# Patient Record
Sex: Male | Born: 1995 | Race: White | Hispanic: No | Marital: Single | Smoking: Current every day smoker
Health system: Southern US, Community
[De-identification: ages and names within clinical notes are randomized; demographics above are authoritative.]

---
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
## 2014-05-04 ENCOUNTER — Emergency Department: Payer: Self-pay | Admitting: Emergency Medicine

## 2014-05-05 ENCOUNTER — Emergency Department: Payer: Self-pay | Admitting: Emergency Medicine

## 2019-09-06 ENCOUNTER — Other Ambulatory Visit: Payer: Self-pay

## 2019-09-06 DIAGNOSIS — Z20822 Contact with and (suspected) exposure to covid-19: Secondary | ICD-10-CM

## 2019-09-09 LAB — NOVEL CORONAVIRUS, NAA: SARS-CoV-2, NAA: NOT DETECTED

## 2020-01-06 ENCOUNTER — Emergency Department
Admission: EM | Admit: 2020-01-06 | Discharge: 2020-01-06 | Disposition: A | Discharge status: Home / Self Care | Payer: Self-pay | Attending: Emergency Medicine | Admitting: Emergency Medicine

## 2020-01-06 ENCOUNTER — Other Ambulatory Visit: Payer: Self-pay

## 2020-01-06 DIAGNOSIS — R112 Nausea with vomiting, unspecified: Secondary | ICD-10-CM | POA: Insufficient documentation

## 2020-01-06 DIAGNOSIS — F17210 Nicotine dependence, cigarettes, uncomplicated: Secondary | ICD-10-CM | POA: Insufficient documentation

## 2020-01-06 DIAGNOSIS — Z0289 Encounter for other administrative examinations: Secondary | ICD-10-CM | POA: Insufficient documentation

## 2020-01-06 NOTE — ED Triage Notes (Signed)
Pt reports that he started this am with N/V - vomited x4-5 in 24 hours -  pt children have had same x3 days - pt reports that he called out of work and requires a doctors note to return

## 2020-01-06 NOTE — ED Provider Notes (Signed)
[facility] Emergency Department Provider Note  ____________________________________________  Time seen: Approximately 9:33 PM  I have reviewed the triage vital signs and the nursing notes.   HISTORY  Chief Complaint Vomiting   HPI Jeremy Watkins is a 24 y.o. male who presents to the emergency department for a work excuse for today.  He states that his entire family has had a 24-hour stomach virus and he himself had some nausea and vomiting earlier today.  Because he called out of work, he has to have a note that says that he is able to return.  He denies any complaints currently.  No vomiting in several hours.  History reviewed. No pertinent past medical history.  There are no problems to display for this patient.   History reviewed. No pertinent surgical history.  Prior to Admission medications   Not on File    Allergies Patient has no known allergies.  No family history on file.  Social History Social History   Tobacco Use  . Smoking status: Current Every Day Smoker    Packs/day: 1.00    Types: Cigarettes  . Smokeless tobacco: Never Used  Substance Use Topics  . Alcohol use: Never  . Drug use: Never    Review of Systems Constitutional: Negative for fever. ENT: Negative for sore throat. Respiratory: Negative for cough Gastrointestinal: No abdominal pain.  Positive for nausea and vomiting.  No diarrhea.  Musculoskeletal: Negative for generalized body aches. Skin: Negative for rash/lesion/wound. Neurological: Negative for headaches, focal weakness or numbness.  ____________________________________________   PHYSICAL EXAM:  VITAL SIGNS: ED Triage Vitals  Enc Vitals Group     BP 01/06/20 2030 126/61     Pulse Rate 01/06/20 2030 97     Resp 01/06/20 2030 16     Temp 01/06/20 2030 98.2 F (36.8 C)     Temp Source 01/06/20 2030 Oral     SpO2 01/06/20 2030 99 %     Weight 01/06/20 2031 145 lb (65.8 kg)     Height 01/06/20 2031 5'  10" (1.778 m)     Head Circumference --      Peak Flow --      Pain Score 01/06/20 2030 0     Pain Loc --      Pain Edu? --      Excl. in GC? --     Constitutional: Alert and oriented. Well appearing and in no acute distress. Eyes: Conjunctivae are normal. PERRL. EOMI. Head: Atraumatic. Nose: No congestion/rhinnorhea. Mouth/Throat: Mucous membranes are moist. Neck: No stridor.  Cardiovascular: Normal rate, regular rhythm. Good peripheral circulation. Respiratory: Normal respiratory effort. Musculoskeletal: Full ROM throughout.  Neurologic:  Normal speech and language. No gross focal neurologic deficits are appreciated. Speech is normal. No gait instability. Skin:  Skin is warm, dry and intact. No rash noted. Psychiatric: Mood and affect are normal. Speech and behavior are normal.  ____________________________________________   LABS (all labs ordered are listed, but only abnormal results are displayed)  Labs Reviewed - No data to display ____________________________________________  EKG  Not indicated ____________________________________________  RADIOLOGY  Not indicated ____________________________________________   PROCEDURES  None ____________________________________________   INITIAL IMPRESSION / ASSESSMENT AND PLAN / ED COURSE     Pertinent labs & imaging results that were available during my care of the patient were reviewed by me and considered in my medical decision making (see chart for details).  Patient was advised stay well-hydrated, start with bland diet and increase slowly.  He  was advised that if he develops any other symptoms of concern, that he is to see his primary care provider or return to the ER. Work note provided to return tomorrow unless he has fevers or continued vomiting. ____________________________________________   FINAL CLINICAL IMPRESSION(S) / ED DIAGNOSES  Final diagnoses:  Encounter to obtain excuse from work        Jeremy Watkins, Jeremy Watkins 01/06/20 2137    Jeremy Bruce, MD 01/09/20 [phone]

## 2020-09-13 ENCOUNTER — Emergency Department: Payer: Self-pay

## 2020-09-13 ENCOUNTER — Other Ambulatory Visit: Payer: Self-pay

## 2020-09-13 ENCOUNTER — Encounter: Payer: Self-pay | Admitting: Emergency Medicine

## 2020-09-13 ENCOUNTER — Emergency Department
Admission: EM | Admit: 2020-09-13 | Discharge: 2020-09-13 | Disposition: A | Discharge status: Home / Self Care | Payer: Self-pay | Attending: Emergency Medicine | Admitting: Emergency Medicine

## 2020-09-13 DIAGNOSIS — F17210 Nicotine dependence, cigarettes, uncomplicated: Secondary | ICD-10-CM | POA: Insufficient documentation

## 2020-09-13 DIAGNOSIS — R519 Headache, unspecified: Secondary | ICD-10-CM | POA: Insufficient documentation

## 2020-09-13 DIAGNOSIS — Z20822 Contact with and (suspected) exposure to covid-19: Secondary | ICD-10-CM | POA: Insufficient documentation

## 2020-09-13 LAB — RESP PANEL BY RT-PCR (FLU A&B, COVID) ARPGX2
Influenza A by PCR: NEGATIVE
Influenza B by PCR: NEGATIVE
SARS Coronavirus 2 by RT PCR: NEGATIVE

## 2020-09-13 MED ORDER — KETOROLAC TROMETHAMINE 30 MG/ML IJ SOLN
30.0000 mg | Freq: Once | INTRAMUSCULAR | Status: DC
  Filled 2020-09-13: qty 1

## 2020-09-13 MED ORDER — ONDANSETRON HCL 4 MG/2ML IJ SOLN
4.0000 mg | Freq: Once | INTRAMUSCULAR | Status: DC
  Filled 2020-09-13: qty 2

## 2020-09-13 MED ORDER — DIPHENHYDRAMINE HCL 50 MG/ML IJ SOLN
25.0000 mg | Freq: Once | INTRAMUSCULAR | Status: DC
  Filled 2020-09-13: qty 1

## 2020-09-13 MED ORDER — SODIUM CHLORIDE 0.9 % IV BOLUS: 1000.0000 mL | Freq: Once | INTRAVENOUS | Status: DC

## 2020-09-13 NOTE — ED Notes (Signed)
See triage note. H/a not responding to meds.

## 2020-09-13 NOTE — ED Notes (Addendum)
Morrie Sheldon, PA at bedside to discuss plan of care with this pt. Pt has refused IV medications. Pt stormed out and states, "that is fucking ridiculous, if you aint going to give me anything then why you keep talking." at this point, pt has eloped

## 2020-09-13 NOTE — ED Provider Notes (Signed)
[facility] Emergency Department Provider Note  ____________________________________________  Time seen: Approximately 1:43 PM  I have reviewed the triage vital signs and the nursing notes.   HISTORY  Chief Complaint Headache    HPI Jeremy Watkins is a 24 y.o. male presents to emergency department for evaluation of a headache for 3 days.  Patient states that it feels like a band is squeezing around his head.  He does not regularly get headaches.  He denies any sick contacts.  He has not received a Covid vaccination.  He has taken Tylenol and Motrin at home without relief.  No fever, visual changes, light sensitivity, nausea, vomiting.   History reviewed. No pertinent past medical history.  There are no problems to display for this patient.   History reviewed. No pertinent surgical history.  Prior to Admission medications   Not on File    Allergies Patient has no known allergies.  No family history on file.  Social History Social History   Tobacco Use  . Smoking status: Current Every Day Smoker    Packs/day: 1.00    Types: Cigarettes  . Smokeless tobacco: Never Used  Substance Use Topics  . Alcohol use: Never  . Drug use: Never     Review of Systems  Constitutional: No fever/chills Cardiovascular: No chest pain. Respiratory: No SOB. Gastrointestinal: No abdominal pain.  No nausea, no vomiting.  Musculoskeletal: Negative for musculoskeletal pain. Skin: Negative for rash, abrasions, lacerations, ecchymosis. Neurological: Negative for numbness or tingling.  Positive for headache.   ____________________________________________   PHYSICAL EXAM:  VITAL SIGNS: ED Triage Vitals  Enc Vitals Group     BP 09/13/20 1316 (!) 108/56     Pulse Rate 09/13/20 1316 73     Resp 09/13/20 1316 16     Temp 09/13/20 1316 97.6 F (36.4 C)     Temp Source 09/13/20 1316 Oral     SpO2 09/13/20 1316 97 %     Weight 09/13/20 1311 140 lb (63.5 kg)      Height 09/13/20 1311 5\' 10"  (1.778 m)     Head Circumference --      Peak Flow --      Pain Score 09/13/20 1311 9     Pain Loc --      Pain Edu? --      Excl. in GC? --      Constitutional: Alert and oriented. Well appearing and in no acute distress. Eyes: Conjunctivae are normal. PERRL. EOMI. Head: Atraumatic. ENT:      Ears:      Nose: No congestion/rhinnorhea.      Mouth/Throat: Mucous membranes are moist.  Neck: No stridor. No cervical spine tenderness to palpation. Cardiovascular: Normal rate, regular rhythm.  Good peripheral circulation. Respiratory: Normal respiratory effort without tachypnea or retractions. Lungs CTAB. Good air entry to the bases with no decreased or absent breath sounds. Gastrointestinal: Bowel sounds 4 quadrants. Soft and nontender to palpation. No guarding or rigidity. No palpable masses. No distention.  Musculoskeletal: Full range of motion to all extremities. No gross deformities appreciated. Neurologic:  Normal speech and language. No gross focal neurologic deficits are appreciated.  Skin:  Skin is warm, dry and intact. No rash noted. Psychiatric: Mood and affect are normal. Speech and behavior are normal. Patient exhibits appropriate insight and judgement.   ____________________________________________   LABS (all labs ordered are listed, but only abnormal results are displayed)  Labs Reviewed  RESP PANEL BY RT-PCR (FLU A&B, COVID)  [id number]   ____________________________________________  EKG   ____________________________________________  RADIOLOGY   CT Head Wo Contrast  Result Date: 09/13/2020 CLINICAL DATA:  Chronic headache for several days EXAM: CT HEAD WITHOUT CONTRAST TECHNIQUE: Contiguous axial images were obtained from the base of the skull through the vertex without intravenous contrast. COMPARISON:  None. FINDINGS: Brain: Ventricles and sulci are normal in size and configuration. The right lateral ventricle is slightly larger  than the left lateral ventricle, a presumed anatomic variant. There is no intracranial mass, hemorrhage, extra-axial fluid collection, or midline shift. The brain parenchyma appears unremarkable. No evident acute infarct. Vascular: No hyperdense vessel. No appreciable vascular calcification. Skull: Bony calvarium appears intact. Sinuses/Orbits: Kozel thickening noted in several ethmoid air cells. Other visualized paranasal sinuses are clear. Visualized orbits appear symmetric bilaterally. Other: Mastoid air cells are clear. IMPRESSION: Foci of paranasal sinus disease.  Study otherwise unremarkable. Electronically Signed   By: Bretta Bang III M.D.   On: 09/13/2020 13:54    ____________________________________________    PROCEDURES  Procedure(s) performed:    Procedures    Medications  ketorolac (TORADOL) 30 MG/ML injection 30 mg (30 mg Intravenous Not Given 09/13/20 1522)  diphenhydrAMINE (BENADRYL) injection 25 mg (25 mg Intravenous Not Given 09/13/20 1522)  ondansetron (ZOFRAN) injection 4 mg (4 mg Intravenous Not Given 09/13/20 1522)  sodium chloride 0.9 % bolus 1,000 mL (1,000 mLs Intravenous Not Given 09/13/20 1522)     ____________________________________________   INITIAL IMPRESSION / ASSESSMENT AND PLAN / ED COURSE  Pertinent labs & imaging results that were available during my care of the patient were reviewed by me and considered in my medical decision making (see chart for details).  Review of the [place] CSRS was performed in accordance of the [facility] prior to dispensing any controlled drugs.   Patient presented to emergency department for evaluation of a headache for 3 days.  Patient does not have a history of headache so CT scan was ordered to initially evaluate.  Covid test is also ordered.  Patient is agreeable to medication for his headache.  ----------------------------------------- 2:20 PM on 09/13/2020 -----------------------------------------  CT scan is  negative for acute abnormalities.  Migraine cocktail of IV Toradol, Benadryl, Zofran was ordered for patient's headache.  ----------------------------------------- 3:12 PM on 09/13/2020 -----------------------------------------  Patient states that he did not come here for any shots and only came here for a migraine pill.  Patient states that he is afraid of needles and does not want any shots or IVs.  Given patient's fear of needles, I will order patient oral medication for his headache.  Patient states that he is not going to waste any more time and is going to leave.  Patient walked out of the ER.    Jeremy Watkins was evaluated in Emergency Department on 09/13/2020 for the symptoms described in the history of present illness. He was evaluated in the context of the global COVID-19 pandemic, which necessitated consideration that the patient might be at risk for infection with the SARS-CoV-2 virus that causes COVID-19. Institutional protocols and algorithms that pertain to the evaluation of patients at risk for COVID-19 are in a state of rapid change based on information released by regulatory bodies including the CDC and federal and state organizations. These policies and algorithms were followed during the patient's care in the ED.   ____________________________________________  FINAL CLINICAL IMPRESSION(S) / ED DIAGNOSES  Final diagnoses:  Acute intractable headache, unspecified headache type      NEW MEDICATIONS STARTED DURING  THIS VISIT:  ED Discharge Orders    None          This chart was dictated using voice recognition software/Dragon. Despite best efforts to proofread, errors can occur which can change the meaning. Any change was purely unintentional.    Enid Derry, PA-C 09/13/20 1821    Sharman Cheek, MD 09/15/20 [phone]

## 2020-09-13 NOTE — ED Notes (Signed)
This RN to administer medication at this time. Pt states, "I dont want anything involving needles. I will just go home." Spoke with [place], [place] about this conversation.

## 2020-09-13 NOTE — ED Triage Notes (Signed)
Pt reports HA for the past 3-4 days unrelieved by OTC meds.

## 2021-05-14 IMAGING — CT CT HEAD W/O CM
3 series · 16 of 46 positions shown, 19 images · non-contrast
Comparison: None.

CLINICAL DATA: Chronic headache for several days

EXAM:
CT HEAD WITHOUT CONTRAST
TECHNIQUE: Contiguous axial images were obtained from the base of the skull
through the vertex without intravenous contrast.

[Series 2: head wo · axial · 0.42mm/px · z∈[-131,-11]mm · 10 of 29 slices shown, 13 images]
[im 3/29  brain]
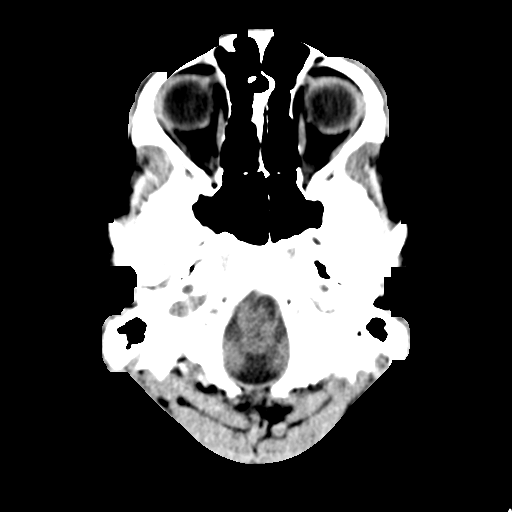
[im 3/29  bone]
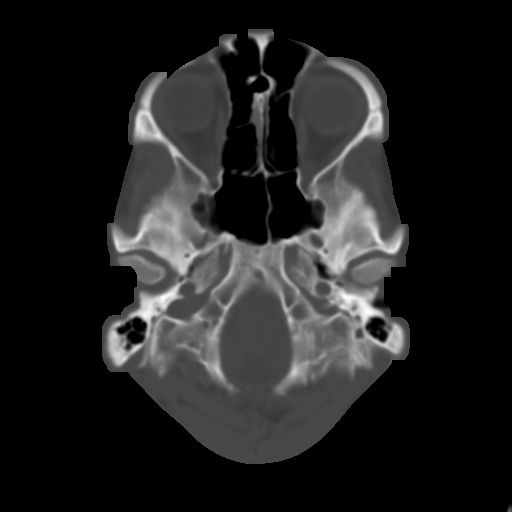
[im 6/29  brain]
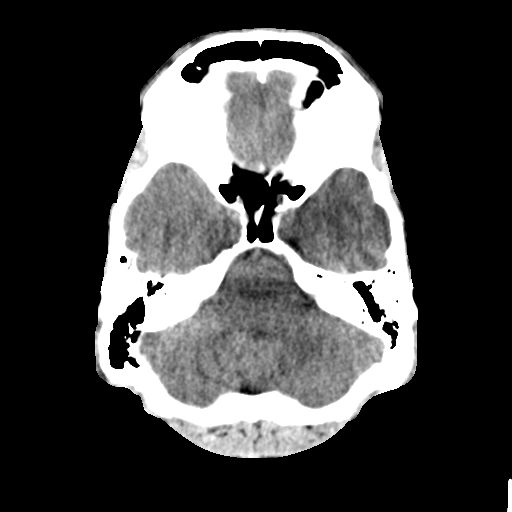
[im 8/29  brain]
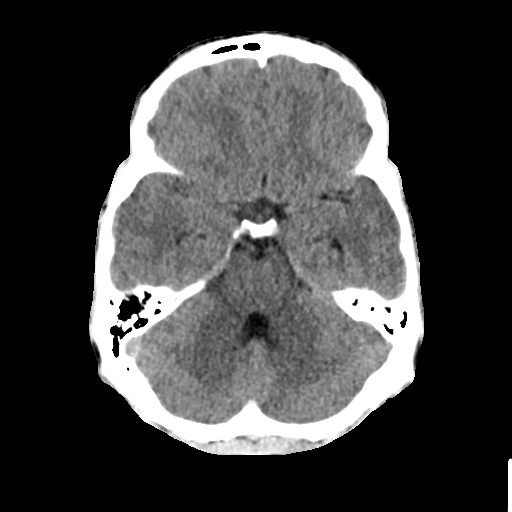
[im 11/29  brain]
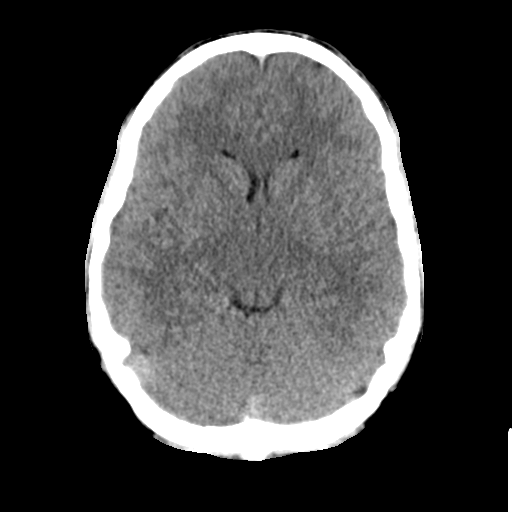
[im 14/29  brain]
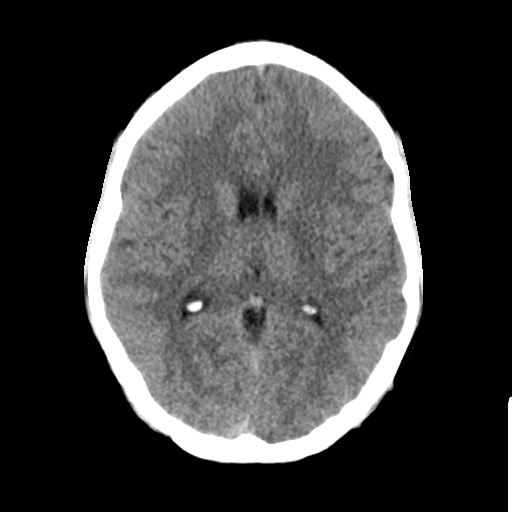
[im 14/29  bone]
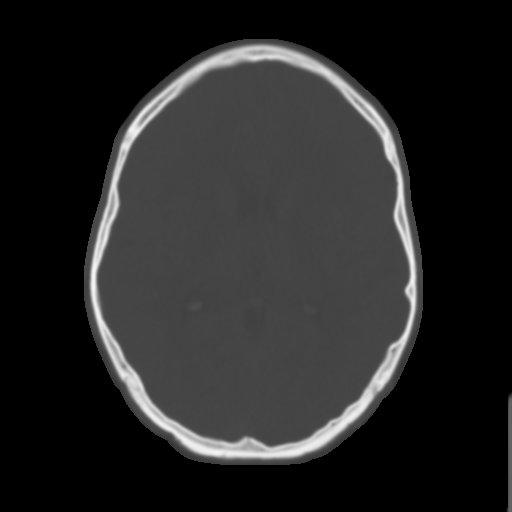
[im 16/29  brain]
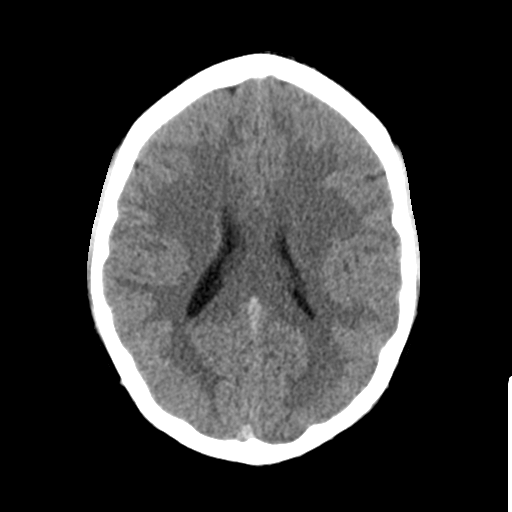
[im 19/29  brain]
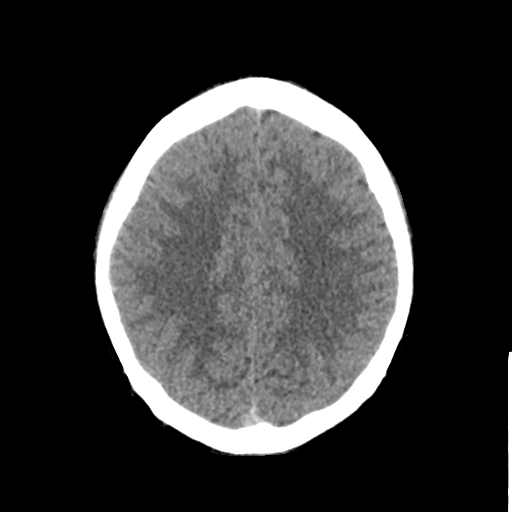
[im 22/29  brain]
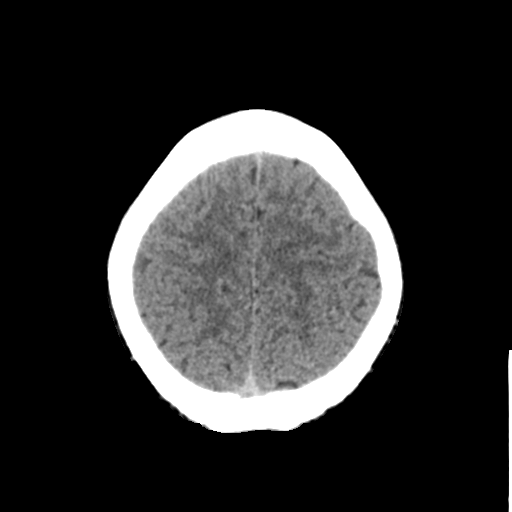
[im 24/29  brain]
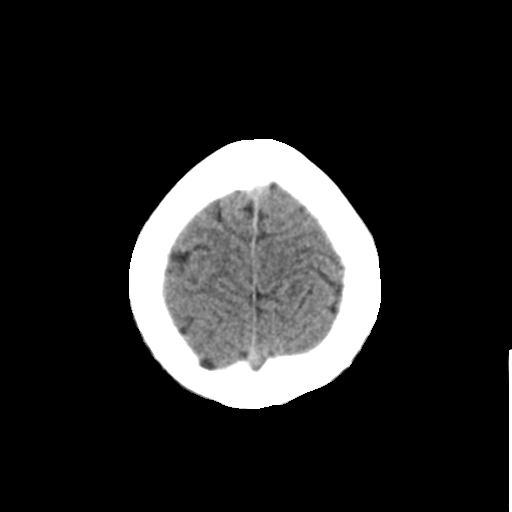
[im 24/29  bone]
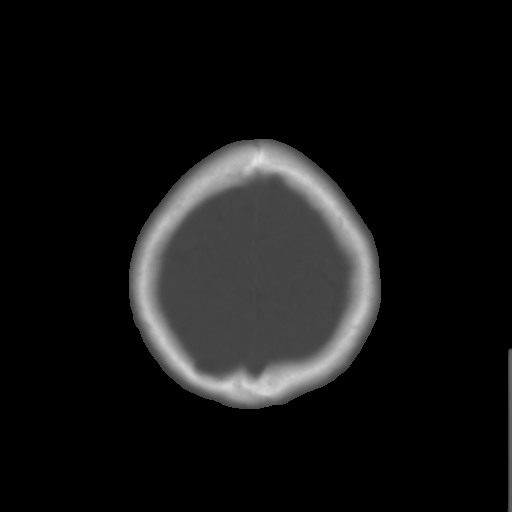
[im 27/29  brain]
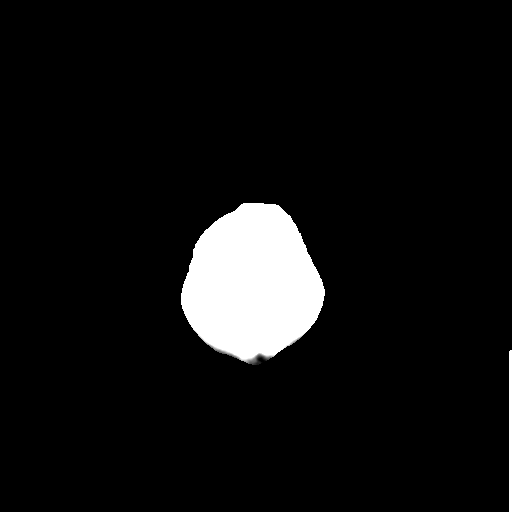

[Series 4: coronal soft tissue · coronal · 0.29mm/px · 3 of 66 slices shown]
[im 22/66  brain]
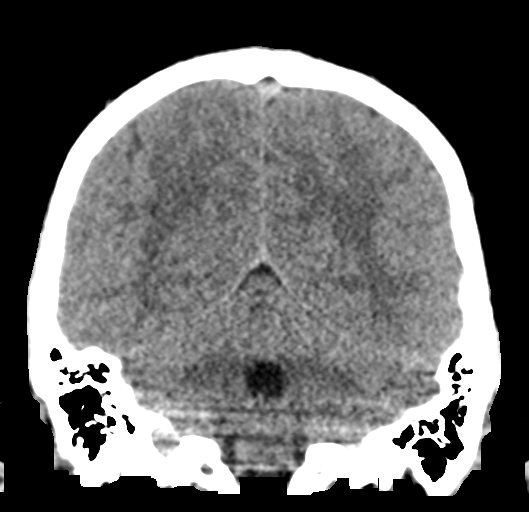
[im 29/66  brain]
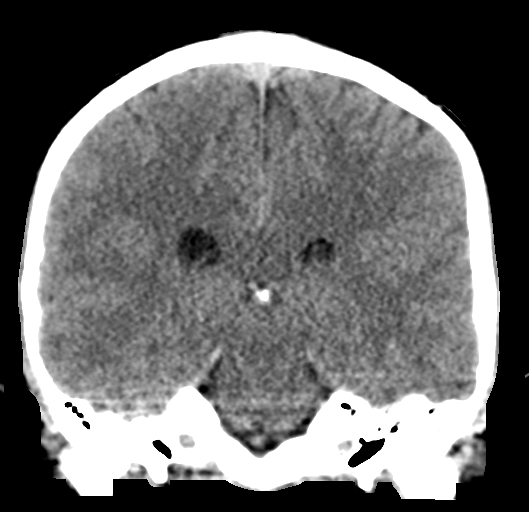
[im 37/66  brain]
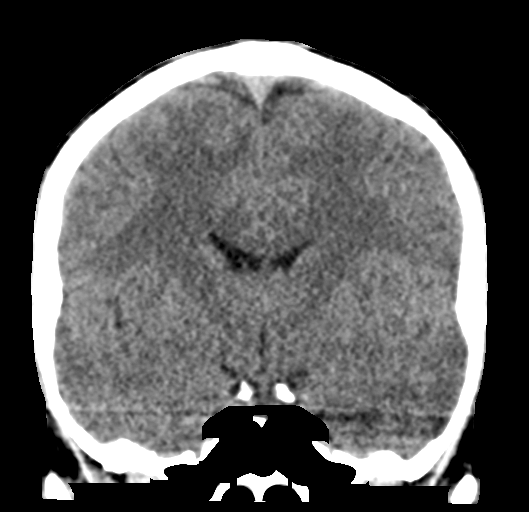

[Series 5: sagittal soft tissue · sagittal · 0.29mm/px · 3 of 52 slices shown]
[im 18/52  brain]
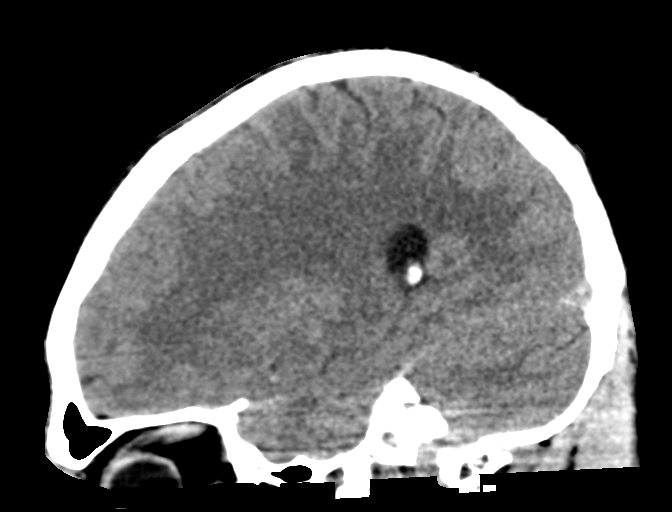
[im 26/52  brain]
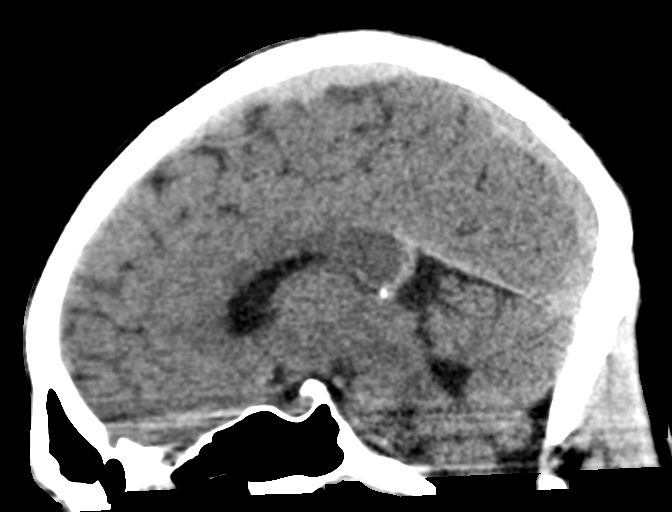
[im 35/52  brain]
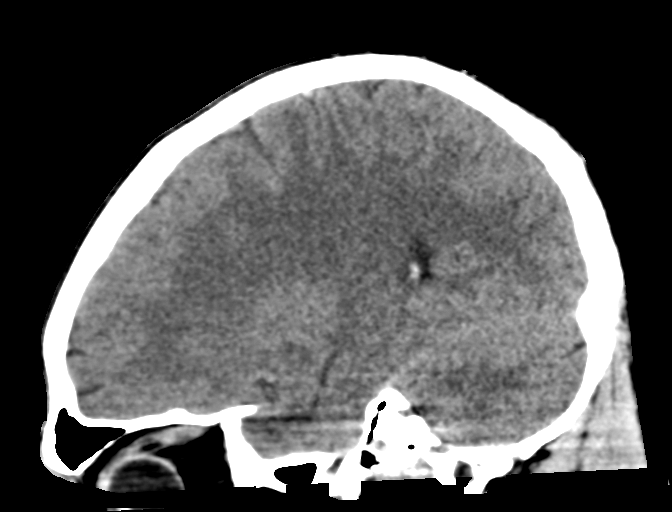

[16 of 46 positions shown; findings below may reference images not displayed]

FINDINGS: Brain: Ventricles and sulci are normal in size and configuration.
The right lateral ventricle is slightly larger than the left lateral
ventricle, a presumed anatomic variant. There is no intracranial
mass, hemorrhage, extra-axial fluid collection, or midline shift.
The brain parenchyma appears unremarkable. No evident acute infarct.

Vascular: No hyperdense vessel. No appreciable vascular
calcification.

Skull: Bony calvarium appears intact.

Sinuses/Orbits: Taule thickening noted in several ethmoid air cells.
Other visualized paranasal sinuses are clear. Visualized orbits
appear symmetric bilaterally.

Other: Mastoid air cells are clear.
IMPRESSION: Foci of paranasal sinus disease.  Study otherwise unremarkable.

## 2021-08-28 ENCOUNTER — Emergency Department
Admission: EM | Admit: 2021-08-28 | Discharge: 2021-08-28 | Disposition: A | Discharge status: Home / Self Care | Payer: Self-pay | Attending: Emergency Medicine | Admitting: Emergency Medicine

## 2021-08-28 ENCOUNTER — Other Ambulatory Visit: Payer: Self-pay

## 2021-08-28 ENCOUNTER — Emergency Department: Payer: Self-pay

## 2021-08-28 DIAGNOSIS — Z5321 Procedure and treatment not carried out due to patient leaving prior to being seen by health care provider: Secondary | ICD-10-CM | POA: Insufficient documentation

## 2021-08-28 DIAGNOSIS — R059 Cough, unspecified: Secondary | ICD-10-CM | POA: Insufficient documentation

## 2021-08-28 DIAGNOSIS — R079 Chest pain, unspecified: Secondary | ICD-10-CM | POA: Insufficient documentation

## 2021-08-28 NOTE — ED Triage Notes (Signed)
Pt called from [facility] to Treatment room, no response

## 2021-08-28 NOTE — ED Triage Notes (Signed)
Pt called from [facility] to treatment room, no response 

## 2021-08-28 NOTE — ED Notes (Signed)
Pt called x's 3, no response ?

## 2021-08-28 NOTE — ED Notes (Signed)
Pt refused blood work in triage.  

## 2021-08-28 NOTE — ED Triage Notes (Signed)
Pt called from [facility] to treatment room, no response ?

## 2021-08-28 NOTE — ED Triage Notes (Signed)
Pt has been having chest pain since yesterday in the center of his chest- pt denies any radiation of pain- pt states he has had some n/v last night but not today- pt states he feels light headed and dizzy as well- pt also states cough

## 2022-04-28 IMAGING — CR DG CHEST 2V
1 series · 2 of 2 positions shown · non-contrast
Comparison: September 07, 2010

CLINICAL DATA: Chest pain since yesterday.

EXAM:
CHEST - 2 VIEW

[Series 1: dg chest 2 view · 0.14mm/px · 2 of 2 slices shown]
[im 1/2]
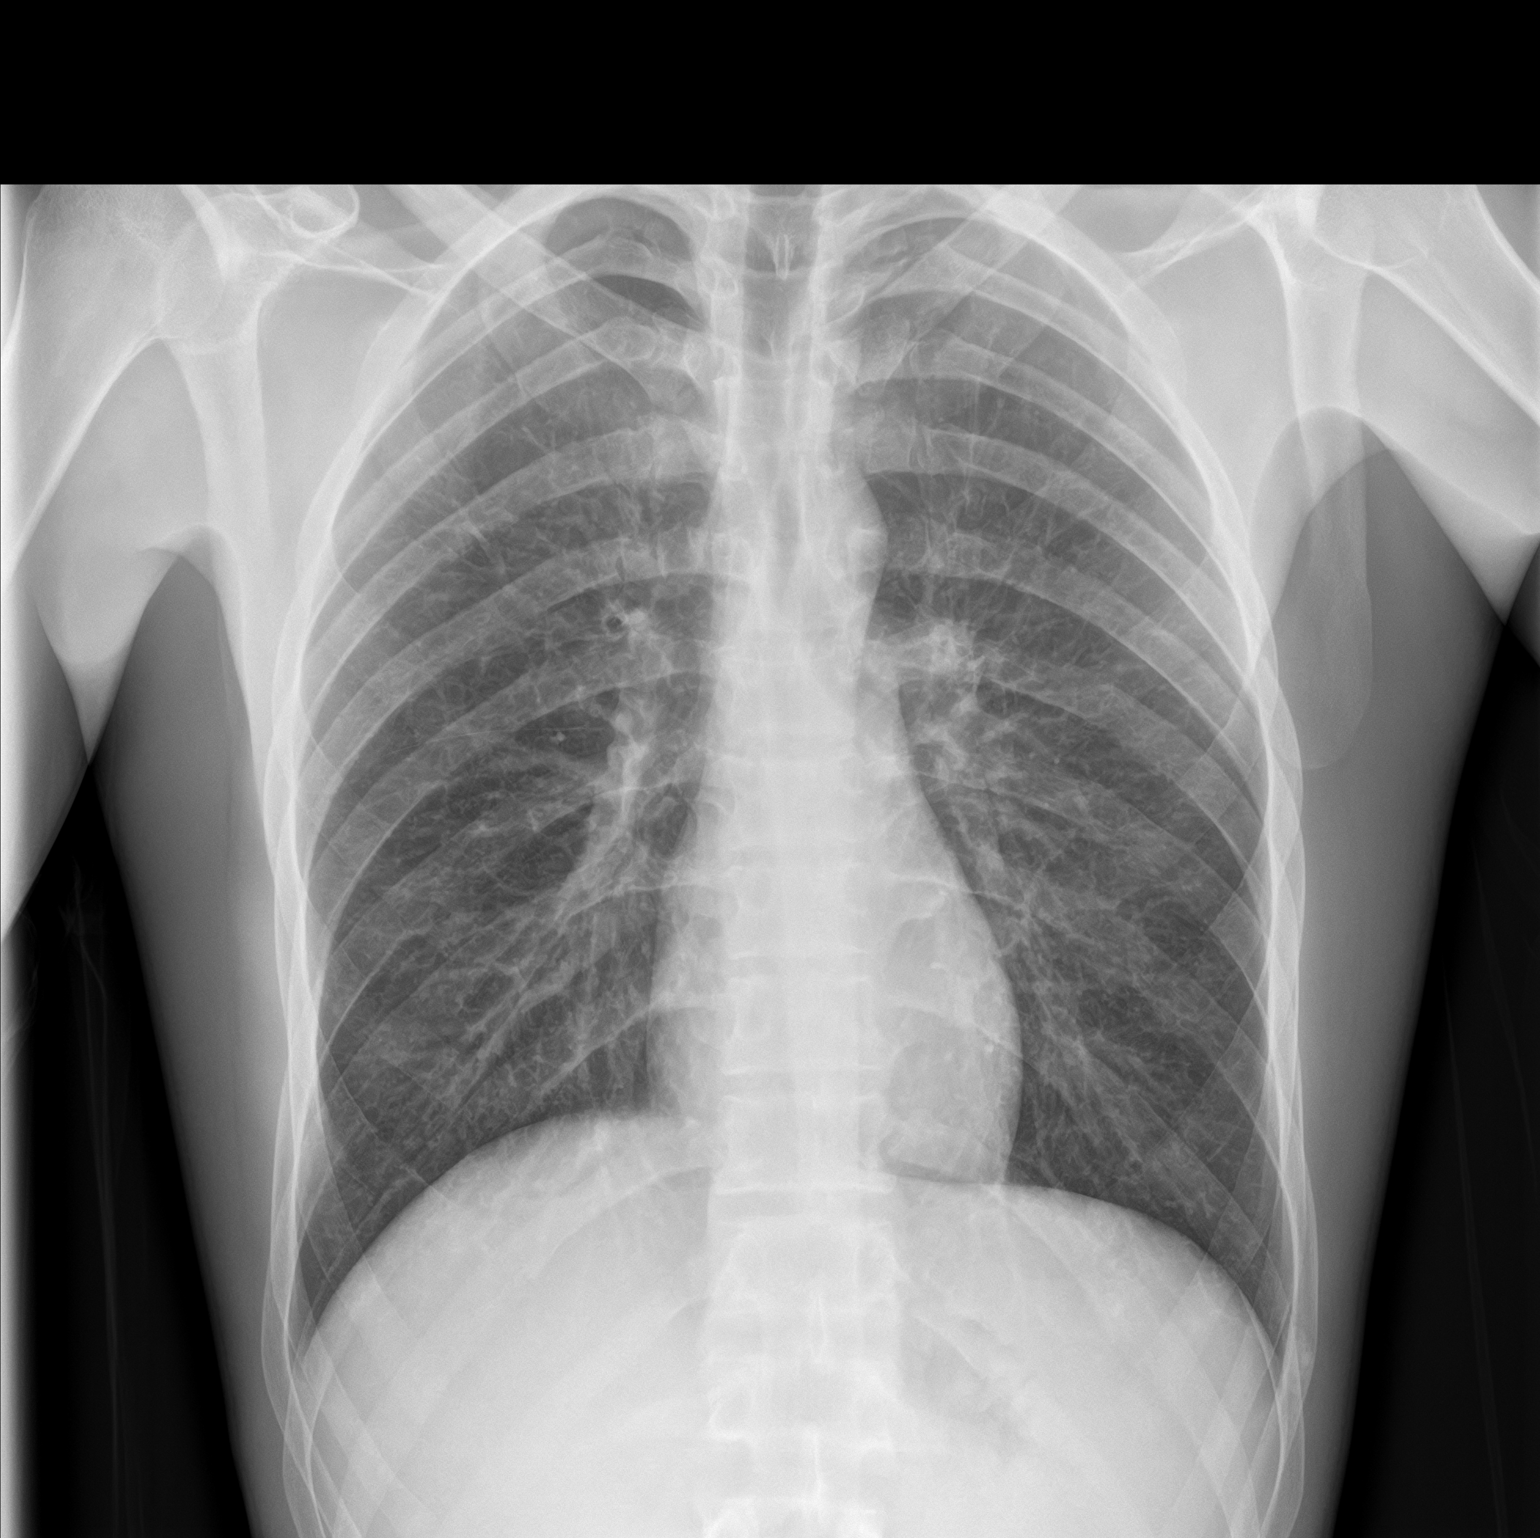
[im 2/2]
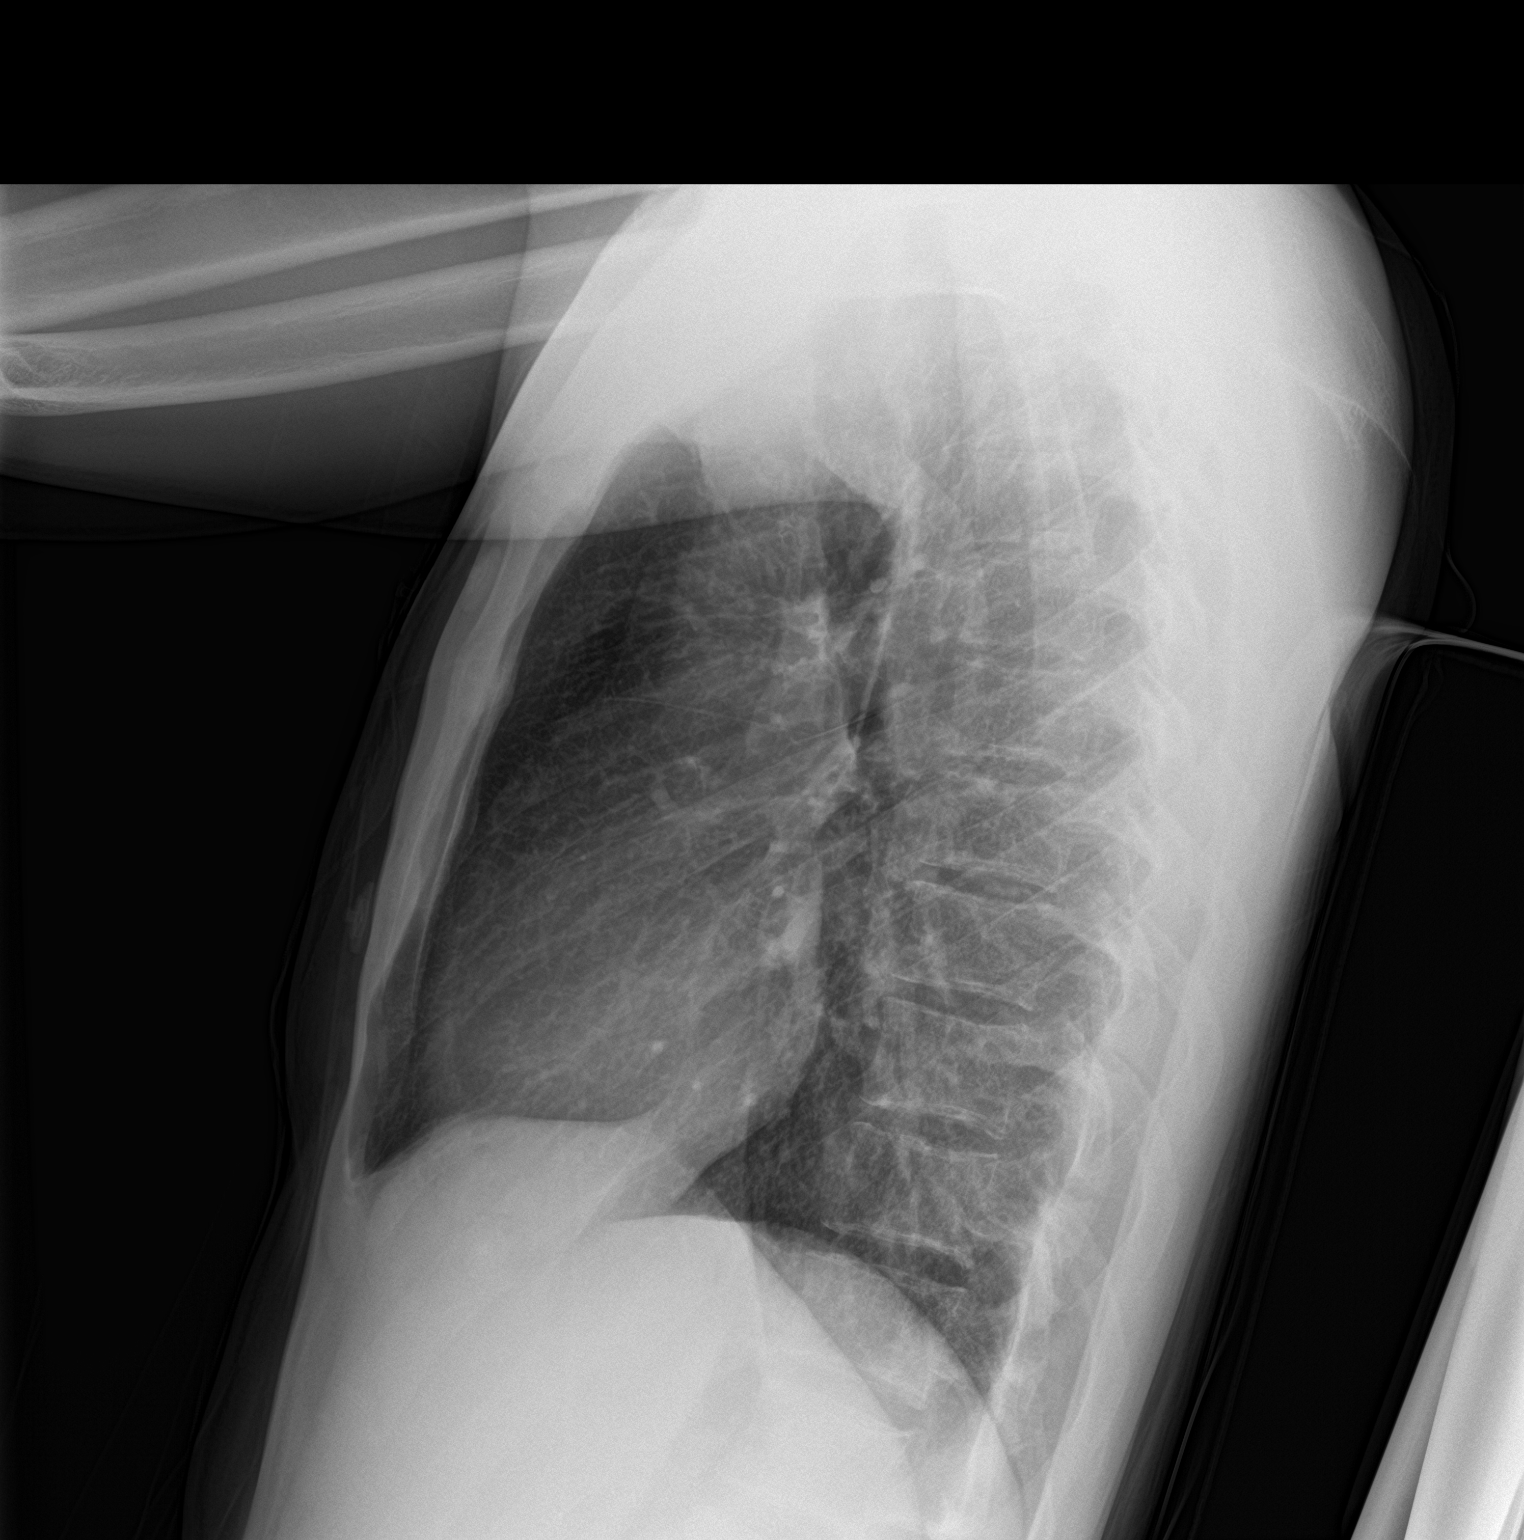

[2 of 2 positions shown; findings below may reference images not displayed]

FINDINGS: The heart size and mediastinal contours are within normal limits.
Both lungs are clear. The visualized skeletal structures are
unremarkable.
IMPRESSION: No active cardiopulmonary disease.
